# Patient Record
Sex: Female | Born: 1950 | Race: White | Hispanic: No | Marital: Married | Smoking: Never smoker
Health system: Southern US, Community
[De-identification: ages and names within clinical notes are randomized; demographics above are authoritative.]

## PROBLEM LIST (undated history)

## (undated) DIAGNOSIS — E7800 Pure hypercholesterolemia, unspecified: Secondary | ICD-10-CM

## (undated) DIAGNOSIS — I10 Essential (primary) hypertension: Secondary | ICD-10-CM

---

## 1999-07-30 ENCOUNTER — Encounter (INDEPENDENT_AMBULATORY_CARE_PROVIDER_SITE_OTHER): Payer: Self-pay | Admitting: Specialist

## 1999-07-30 ENCOUNTER — Other Ambulatory Visit: Admission: RE | Admit: 1999-07-30 | Discharge: 1999-07-30 | Payer: Self-pay | Admitting: Gastroenterology

## 2004-09-16 ENCOUNTER — Ambulatory Visit: Payer: Self-pay | Admitting: Gastroenterology

## 2004-09-29 ENCOUNTER — Ambulatory Visit: Payer: Self-pay | Admitting: Gastroenterology

## 2006-08-17 ENCOUNTER — Ambulatory Visit (HOSPITAL_COMMUNITY): Admission: RE | Admit: 2006-08-17 | Discharge: 2006-08-17 | Payer: Self-pay | Admitting: Obstetrics and Gynecology

## 2007-08-17 ENCOUNTER — Ambulatory Visit: Payer: Self-pay | Admitting: Cardiovascular Disease

## 2007-08-29 ENCOUNTER — Ambulatory Visit: Payer: Self-pay

## 2007-08-29 ENCOUNTER — Encounter: Payer: Self-pay | Admitting: Cardiovascular Disease

## 2008-01-12 IMAGING — US US TRANSVAGINAL NON-OB
1 series · 14 of 25 positions shown · non-contrast
Comparison: none

CLINICAL DATA: Postmenopausal bleeding.
 TRANSABDOMINAL AND TRANSVAGINAL PELVIC ULTRASOUND:
TECHNIQUE: Both transabdominal and transvaginal ultrasound examinations of the pelvis were performed including evaluation of the uterus, ovaries, adnexal regions, and pelvic cul-de-sac.

[Series 1: us transvaginal non-ob · 0.21mm/px · 14 of 60 slices shown]
[im 1/60]
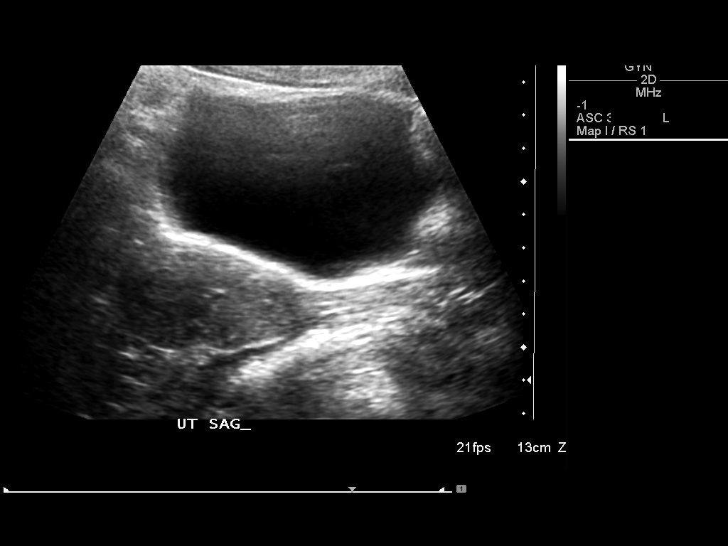
[im 5/60]
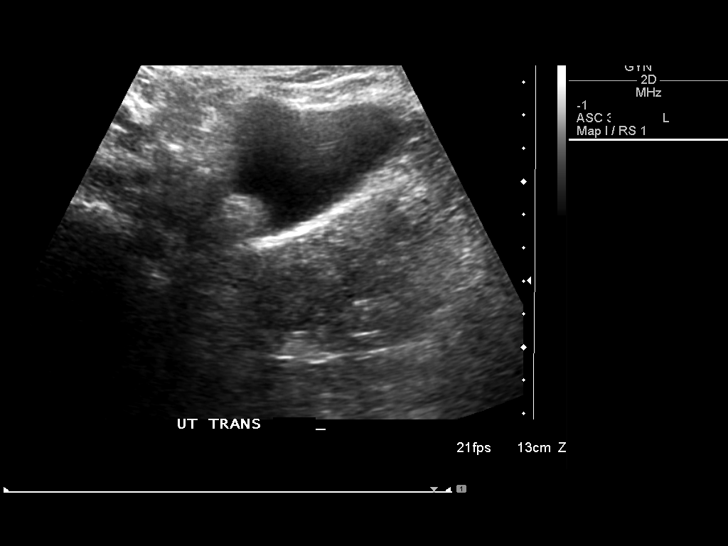
[im 10/60]
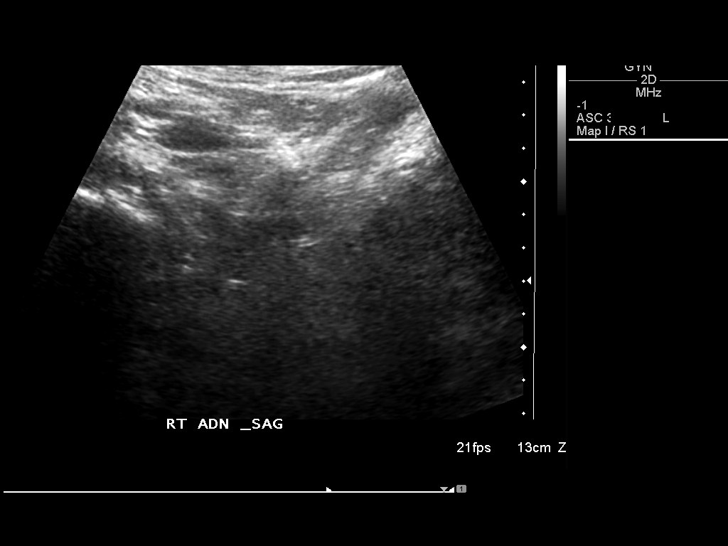
[im 15/60]
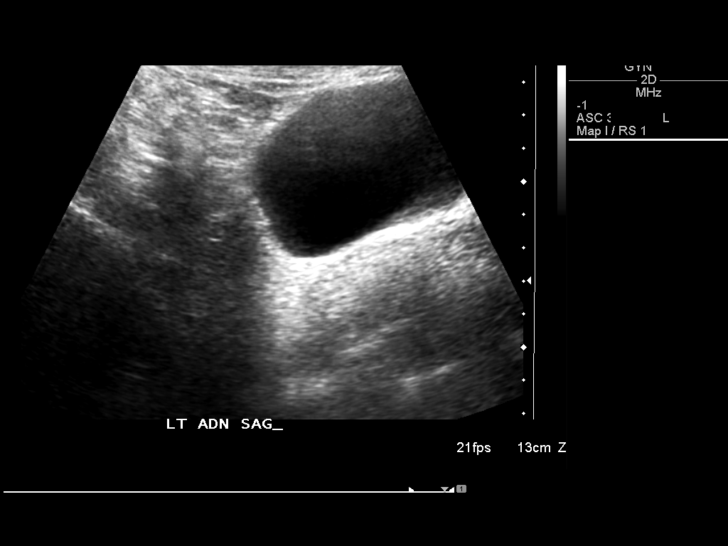
[im 20/60]
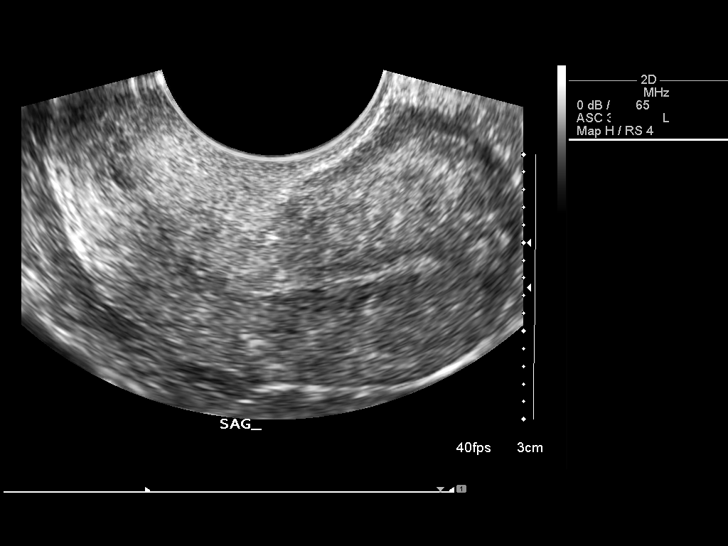
[im 23/60]
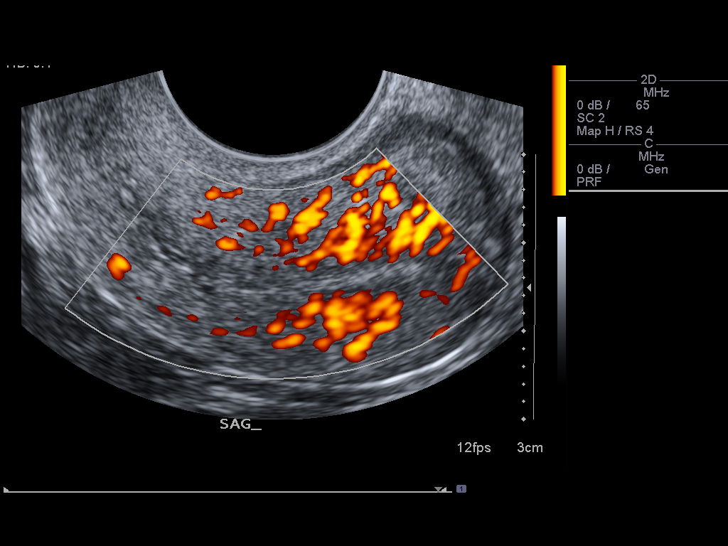
[im 28/60]
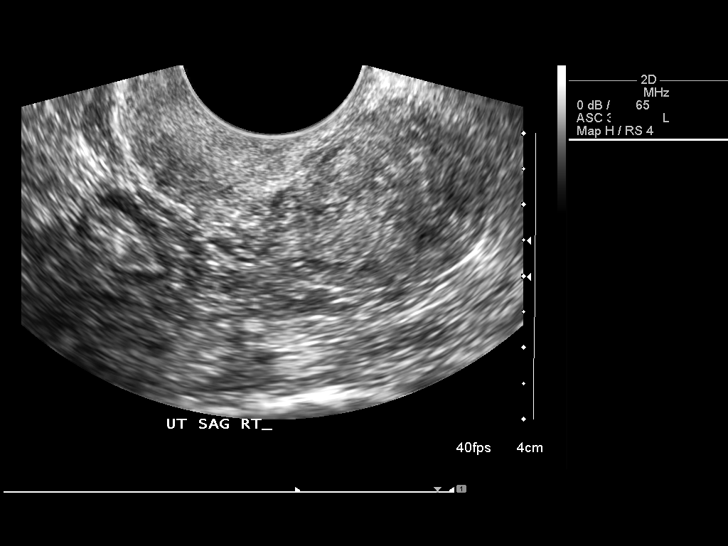
[im 32/60]
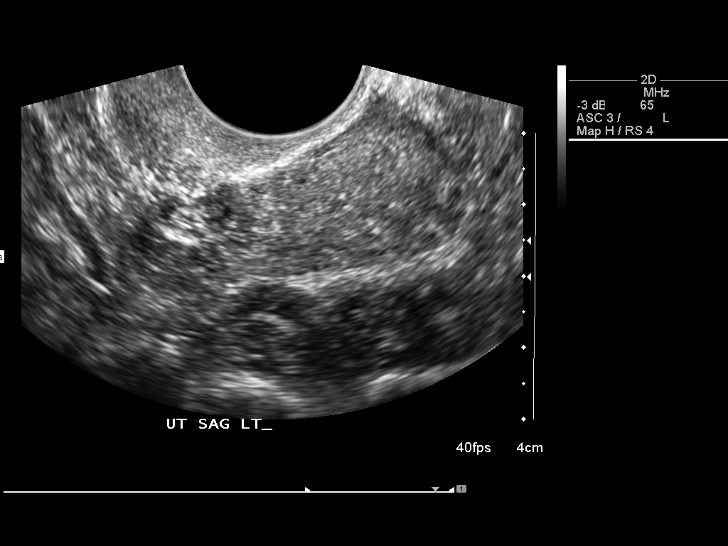
[im 37/60]
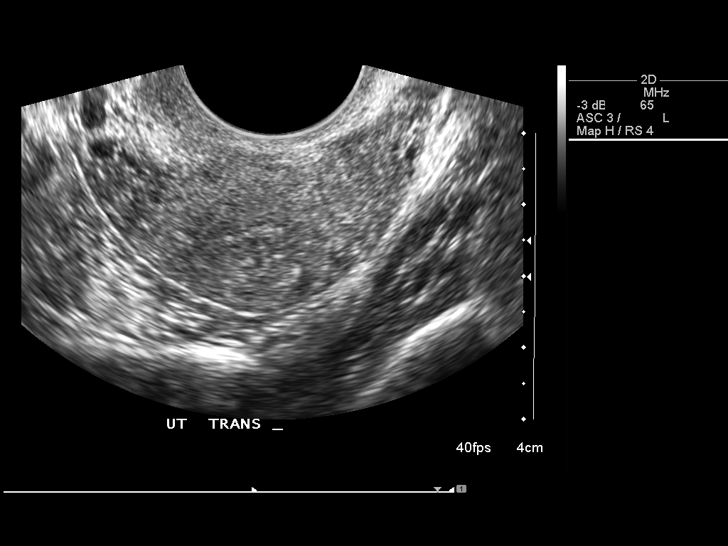
[im 40/60]
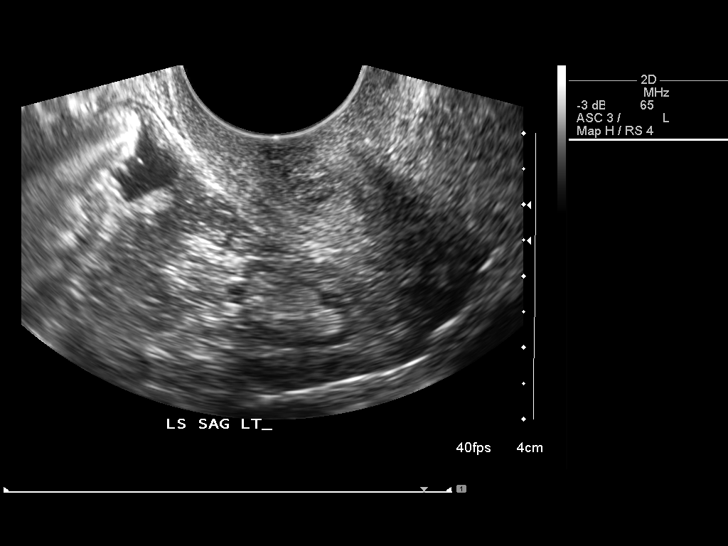
[im 45/60]
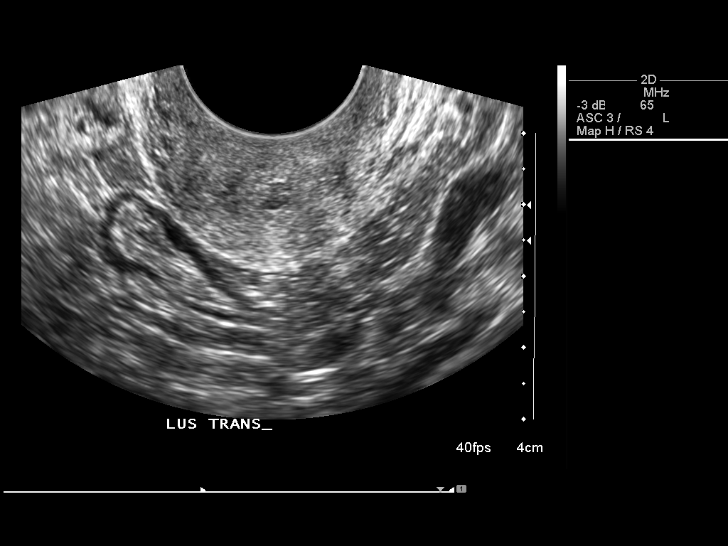
[im 50/60]
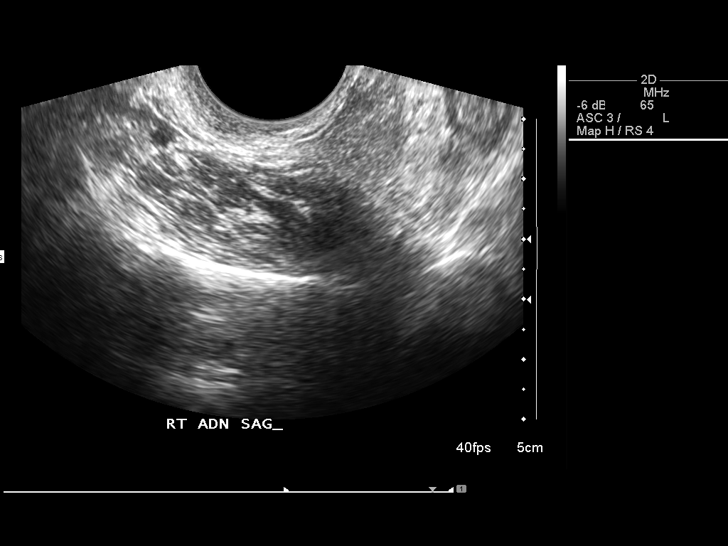
[im 55/60]
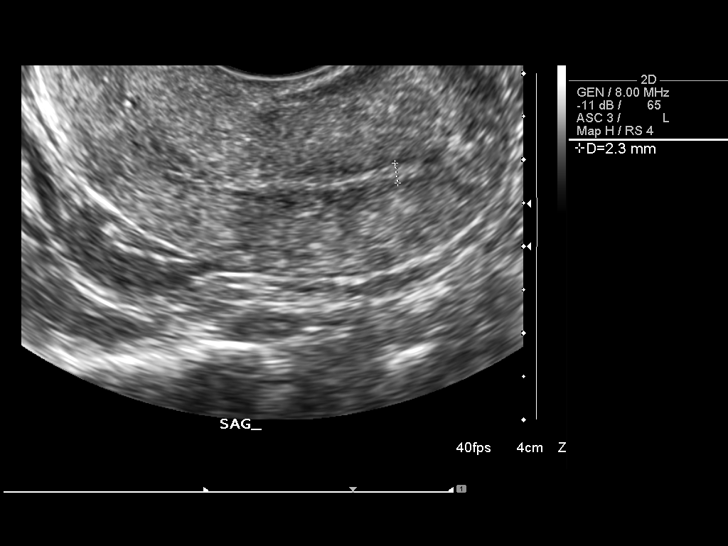
[im 60/60]
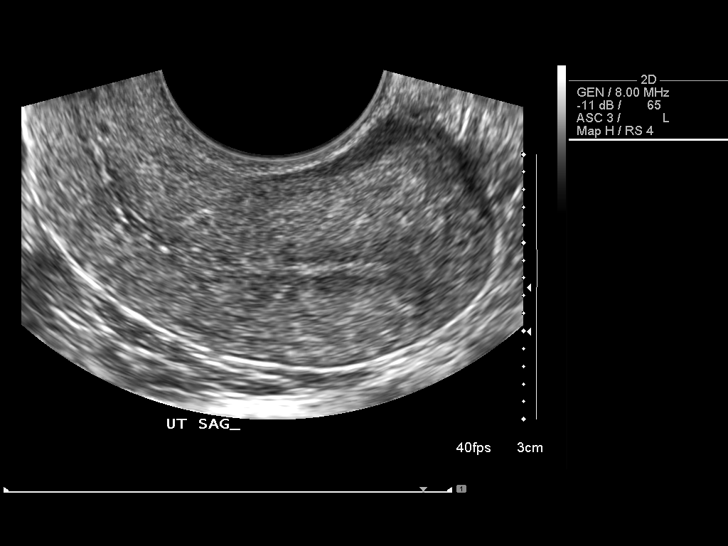

[14 of 25 positions shown; findings below may reference images not displayed]

FINDINGS: The uterus is normal in size, shape, and echotexture. The uterine dimensions are 6.4 x 3.0 x 3.9 cm.  No fibroids are seen. The endometrium is thin and homogeneous measuring 2-3 mm in width.  
 Neither ovary is visualized.  No adnexal masses or free pelvic fluid are seen.
IMPRESSION: Normal uterus and endometrial stripe.

## 2008-12-06 ENCOUNTER — Telehealth (INDEPENDENT_AMBULATORY_CARE_PROVIDER_SITE_OTHER): Payer: Self-pay | Admitting: *Deleted

## 2009-08-08 ENCOUNTER — Encounter (INDEPENDENT_AMBULATORY_CARE_PROVIDER_SITE_OTHER): Payer: Self-pay | Admitting: *Deleted

## 2010-03-06 NOTE — Letter (Signed)
Summary: Colonoscopy Letter  [facility]  [address] [place], [place] [address]   Phone: [phone]  Fax: [phone]      August 08, 2009 MRN: [id number]   [facility] [address] [place], [place]  [address]   Dear Ms. Dado,   According to your medical record, it is time for you to schedule a Colonoscopy. The American Cancer Society recommends this procedure as a method to detect early colon cancer. Patients with a family history of colon cancer, or a personal history of colon polyps or inflammatory bowel disease are at increased risk.  This letter has beeen generated based on the recommendations made at the time of your procedure. If you feel that in your particular situation this may no longer apply, please contact our office.  Please call our office at [phone] to schedule this appointment or to update your records at your earliest convenience.  Thank you for cooperating with [place] to provide you with the very best care possible.   Sincerely,    Vania Rea. Jarold Motto, M.D.  [facility] [phone]

## 2010-06-17 NOTE — Assessment & Plan Note (Signed)
[facility]                            CARDIOLOGY OFFICE NOTE   NAME:STEVENSCleona, Doubleday                     MRN:          [id number]  DATE:08/17/2007                            DOB:          05-Jun-1950    HISTORY:  Ms. Taite is a pleasant 60 year old patient referred by Dr.  Celene Skeen for some chest pain, palpitations, and dyspnea.   The patient has no previous cardiac history.  Coronary risk factors are  limited.  She is a previous smoker, quitting in 1975.  She is  nondiabetic and nonhypertensive.  She does have hypercholesterolemia, on  therapy.   At the end of June, about 2 weeks ago, she had some atypical chest pain.  This occurred after pushing a stroller, which is a little more activity  than she normally does.  She normally has a jogging stroller.  She had  her granddaughter with her and had a different stroller.  She  subsequently had dyspnea and atypical chest pain.  The pain was sharp,  radiated towards the shoulders, and lasted for about an hour.  She had  no associated diaphoresis.  She did feel presyncopal after she had the  sharp pain.   She also indicated it was a very hot humid day.   She has not had any recurrences in the last 2 weeks.   REVIEW OF SYSTEMS:  Otherwise negative.   PAST MEDICAL HISTORY:  Remarkable for hypercholesterolemia.  Otherwise,  it is remarkable for occasional asthma as a child.  Otherwise negative.  She has not had previous surgeries.   ALLERGIES:  She is allergic to PENICILLIN and MYCINS.   MEDICATIONS:  1. Zetia 10 a day.  2. Niaspan 2 g at bedtime.  3. Aspirin.  4. Multivitamins.  5. Fish oil.  6. Vitamin C.   The patient follows an [employer] type diet.  She is a Runner, broadcasting/film/video.  Her days have been cut back to 2 days a week.  She has been under a  little bit of stress as they have been building a second house at [facility], which is finally finished.  Otherwise, she is not particularly  active.  She quit smoking in 1975 and does not drink.   FAMILY HISTORY:  Remarkable for father dying of Parkinson disease at age  70.  Her mother died at age 97 of a stroke.   There is previous question of slight tremulousness or tremor in the  upper extremities, and the patient has been evaluated at [facility], and does  not have Parkinson's.   PHYSICAL EXAMINATION:  GENERAL:  Remarkable for healthy-appearing,  middle-aged white female in no distress.  VITAL SIGNS:  Blood pressure 116/76, pulse 91 and regular, respiratory  rate 14, afebrile, and weight 148.  HEENT:  Unremarkable.  NECK:  Carotids are normal without bruits.  No lymphadenopathy,  thyromegaly, or JVP elevation.  LUNGS:  Clear.  Good diaphragmatic motion.  No wheezing.  HEART:  S1 and S2 with normal heart sounds.  PMI normal.  ABDOMEN:  Benign.  Bowel sounds positive.  No AAA.  No tenderness.  No  bruit.  No hepatosplenomegaly.  No hepatojugular reflux.  EXTREMITIES:  Distal pulses are intact.  No edema.  NEUROLOGIC:  Nonfocal.  SKIN:  Warm and dry.  MUSCULOSKELETAL:  No muscular weakness.   EKG is normal.   IMPRESSION:  1. Atypical chest pain.  Followup stress echo.  2. Palpitations, possible occult paroxysmal supraventricular      tachycardia, but this is in the setting of dehydration and atypical      chest pain.  We will continue to monitor.  If she has recurrent      palpitations, we will get an event monitor.  3. Dyspnea.  Again appears functional.  The patient's cardiopulmonary      exam is normal.  Her EKG is normal.  We will check an      echocardiogram as part of her stress echo to rule out structural      heart disease.  4. Hypercholesterolemia.  Followup primary care MD.  Continue Zetia      and Niaspan.   As long as the patient's stress echo is normal, she will be seen on an  as-needed basis.     Noralyn Pick. Eden Emms, MD, [facility]  Electronically Signed    PCN/MedQ  DD: 08/17/2007  DT: 08/17/2007  Job #:  [id number]   cc:   Charlesetta Shanks

## 2014-04-02 ENCOUNTER — Other Ambulatory Visit: Payer: Self-pay | Admitting: Dermatology

## 2014-04-26 ENCOUNTER — Other Ambulatory Visit: Payer: Self-pay | Admitting: Dermatology

## 2015-08-13 ENCOUNTER — Emergency Department (HOSPITAL_BASED_OUTPATIENT_CLINIC_OR_DEPARTMENT_OTHER)
Admission: EM | Admit: 2015-08-13 | Discharge: 2015-08-13 | Disposition: A | Discharge status: Home / Self Care | Payer: Medicare Other | Attending: Emergency Medicine | Admitting: Emergency Medicine

## 2015-08-13 ENCOUNTER — Encounter (HOSPITAL_BASED_OUTPATIENT_CLINIC_OR_DEPARTMENT_OTHER): Payer: Self-pay

## 2015-08-13 DIAGNOSIS — Z7982 Long term (current) use of aspirin: Secondary | ICD-10-CM | POA: Diagnosis not present

## 2015-08-13 DIAGNOSIS — R110 Nausea: Secondary | ICD-10-CM | POA: Diagnosis not present

## 2015-08-13 DIAGNOSIS — E785 Hyperlipidemia, unspecified: Secondary | ICD-10-CM | POA: Insufficient documentation

## 2015-08-13 DIAGNOSIS — Z79899 Other long term (current) drug therapy: Secondary | ICD-10-CM | POA: Insufficient documentation

## 2015-08-13 DIAGNOSIS — K5900 Constipation, unspecified: Secondary | ICD-10-CM | POA: Diagnosis not present

## 2015-08-13 HISTORY — DX: Pure hypercholesterolemia, unspecified: E78.00

## 2015-08-13 NOTE — ED Notes (Signed)
C/o constipation-last BM 6 days ago-recent narcotic pain use from knee injury-NAD-steady gait

## 2015-08-13 NOTE — Discharge Instructions (Signed)
Used 8 scoops of MiraLAX in 32 ounces of drink of your choice. Stay near a bathroom. Also use a Fleet enema. You can buy this over-the-counter. Return for uncontrolled vomiting sudden severe abdominal pain or not passing gas out of your bottom. Constipation, Adult Constipation is when a person has fewer than three bowel movements a week, has difficulty having a bowel movement, or has stools that are dry, hard, or larger than normal. As people grow older, constipation is more common. A low-fiber diet, not taking in enough fluids, and taking certain medicines may make constipation worse.  CAUSES   Certain medicines, such as antidepressants, pain medicine, iron supplements, antacids, and water pills.   Certain diseases, such as diabetes, irritable bowel syndrome (IBS), thyroid disease, or depression.   Not drinking enough water.   Not eating enough fiber-rich foods.   Stress or travel.   Lack of physical activity or exercise.   Ignoring the urge to have a bowel movement.   Using laxatives too much.  SIGNS AND SYMPTOMS   Having fewer than three bowel movements a week.   Straining to have a bowel movement.   Having stools that are hard, dry, or larger than normal.   Feeling full or bloated.   Pain in the lower abdomen.   Not feeling relief after having a bowel movement.  DIAGNOSIS  Your health care provider will take a medical history and perform a physical exam. Further testing may be done for severe constipation. Some tests may include:  A barium enema X-ray to examine your rectum, colon, and, sometimes, your small intestine.   A sigmoidoscopy to examine your lower colon.   A colonoscopy to examine your entire colon. TREATMENT  Treatment will depend on the severity of your constipation and what is causing it. Some dietary treatments include drinking more fluids and eating more fiber-rich foods. Lifestyle treatments may include regular exercise. If these diet and  lifestyle recommendations do not help, your health care provider may recommend taking over-the-counter laxative medicines to help you have bowel movements. Prescription medicines may be prescribed if over-the-counter medicines do not work.  HOME CARE INSTRUCTIONS   Eat foods that have a lot of fiber, such as fruits, vegetables, whole grains, and beans.  Limit foods high in fat and processed sugars, such as french fries, hamburgers, cookies, candies, and soda.   A fiber supplement may be added to your diet if you cannot get enough fiber from foods.   Drink enough fluids to keep your urine clear or pale yellow.   Exercise regularly or as directed by your health care provider.   Go to the restroom when you have the urge to go. Do not hold it.   Only take over-the-counter or prescription medicines as directed by your health care provider. Do not take other medicines for constipation without talking to your health care provider first.  SEEK IMMEDIATE MEDICAL CARE IF:   You have bright red blood in your stool.   Your constipation lasts for more than 4 days or gets worse.   You have abdominal or rectal pain.   You have thin, pencil-like stools.   You have unexplained weight loss. MAKE SURE YOU:   Understand these instructions.  Will watch your condition.  Will get help right away if you are not doing well or get worse.   This information is not intended to replace advice given to you by your health care provider. Make sure you discuss any questions you have with  your health care provider.   Document Released: 10/18/2003 Document Revised: 02/09/2014 Document Reviewed: 10/31/2012 Elsevier Interactive Patient Education [employer].

## 2015-08-13 NOTE — ED Provider Notes (Signed)
CSN: [id number]     Arrival date & time 08/13/15  1739 History   By signing my name below, I, Rosario AdieWilliam Andrew Hiatt, attest that this documentation has been prepared under the direction and in the presence of Melene Planan Odean Mcelwain, DO.  Electronically Signed: Rosario AdieWilliam Andrew Hiatt, ED Scribe. 08/13/2015. 6:13 PM.   Chief Complaint  Patient presents with  . Constipation   Patient is a 65 y.o. female presenting with constipation. The history is provided by the patient. No language interpreter was used.  Constipation Severity:  Moderate Time since last bowel movement:  6 days Timing:  Constant Progression:  Unchanged Chronicity:  New Context: medication and narcotics   Context: not dehydration, not dietary changes and not stress   Stool description:  Watery Relieved by:  Miralax Worsened by:  Narcotic pain medications Ineffective treatments:  None tried Associated symptoms: nausea   Associated symptoms: no abdominal pain, no dysuria, no fever and no vomiting   Associated symptoms comment:  Positive for bloating. Risk factors: change in medication   Risk factors: no hx of abdominal surgery, no obesity, no recent antibiotic use, no recent illness, no recent surgery and no recent travel    HPI Comments: Sabrina Kelly is a 65 y.o. female with a PMHx of HLD who presents to the Emergency Department complaining of gradual onset, unchanged, constant constipation, with associated bloating, onset 6 days PTA. Pt has taken Miralax intermittently 1 day ago and today prior to coming into the ED and notes that since she has taken it that she has had small, brown, watery bowel movements. Pt has been eating foods and drinking fluids less since the onset of her symptoms. No PSHx to the abdomen. Pt reports that she injured her knee 4 days ago, and she recently started taking Hydrocodone and 81mg  Asprin for her pain to the area. Pt has also been resting significantly more since she injured her knee. She denies emesis or  any other symptoms.   Past Medical History  Diagnosis Date  . High cholesterol    History reviewed. No pertinent past surgical history. No family history on file. Social History  Substance Use Topics  . Smoking status: Never Smoker   . Smokeless tobacco: None  . Alcohol Use: Yes     Comment: occ   OB History    No data available     Review of Systems  Constitutional: Negative for fever and chills.  HENT: Negative for congestion and rhinorrhea.   Eyes: Negative for redness and visual disturbance.  Respiratory: Negative for shortness of breath and wheezing.   Cardiovascular: Negative for chest pain and palpitations.  Gastrointestinal: Positive for nausea, constipation and abdominal distention. Negative for vomiting and abdominal pain.  Genitourinary: Negative for dysuria and urgency.  Musculoskeletal: Negative for myalgias and arthralgias.  Skin: Negative for pallor and wound.  Neurological: Negative for dizziness and headaches.   Allergies  Adhesive; Erythromycin; Latex; and Penicillins  Home Medications   Prior to Admission medications   Medication Sig Start Date End Date Taking? Authorizing Provider  ezetimibe (ZETIA) 10 MG tablet Take 10 mg by mouth daily.   Yes Historical Provider, MD  pravastatin (PRAVACHOL) 40 MG tablet Take 40 mg by mouth daily.   Yes Historical Provider, MD   BP 177/93 mmHg  Pulse 74  Temp(Src) 98.5 F (36.9 C) (Oral)  Resp 18  Ht 5\' 4"  (1.626 m)  Wt 153 lb (69.4 kg)  BMI 26.25 kg/m2  SpO2 97%   Physical  Exam  Constitutional: She is oriented to person, place, and time. She appears well-developed and well-nourished. No distress.  HENT:  Head: Normocephalic and atraumatic.  Eyes: EOM are normal.  Neck: Normal range of motion.  Cardiovascular: Normal rate and regular rhythm.  Exam reveals no gallop and no friction rub.   No murmur heard. Pulmonary/Chest: Effort normal. She has no wheezes. She has no rales.  Abdominal: Soft. She exhibits  no distension. There is no tenderness.  No noted abdominal tenderness.  Genitourinary:  Upon rectal exam: Small, non-bleeding and non-thrombosed hemorrhoid at the 12 o'clock position with clay like stool upon the tip of the finger.  Musculoskeletal: She exhibits no edema or tenderness.  Neurological: She is alert and oriented to person, place, and time.  Skin: Skin is warm and dry. She is not diaphoretic.  Psychiatric: She has a normal mood and affect. Her behavior is normal.  Nursing note and vitals reviewed.  ED Course  Procedures (including critical care time)  DIAGNOSTIC STUDIES: Oxygen Saturation is 97% on RA, normal by my interpretation.   COORDINATION OF CARE: 6:09 PM-Discussed next steps with pt including rectal exam. Pt verbalized understanding and is agreeable with the plan.   Labs Review Labs Reviewed - No data to display  Imaging Review No results found.   I have personally reviewed and evaluated these images and lab results as part of my medical decision-making.   EKG Interpretation None      MDM   Final diagnoses:  None    65 yo F with constipation.  Not impacted on my exam.  Tolerating PO with benign abdominal exam.  Miralax cleanout with enema at home.    I personally performed the services described in this documentation, which was scribed in my presence. The recorded information has been reviewed and is accurate.      Melene Plan, DO 08/13/15 1910

## 2019-01-11 ENCOUNTER — Emergency Department (HOSPITAL_BASED_OUTPATIENT_CLINIC_OR_DEPARTMENT_OTHER)
Admission: EM | Admit: 2019-01-11 | Discharge: 2019-01-11 | Disposition: A | Discharge status: Home / Self Care | Payer: Medicare Other | Attending: Emergency Medicine | Admitting: Emergency Medicine

## 2019-01-11 ENCOUNTER — Encounter (HOSPITAL_BASED_OUTPATIENT_CLINIC_OR_DEPARTMENT_OTHER): Payer: Self-pay | Admitting: Emergency Medicine

## 2019-01-11 ENCOUNTER — Other Ambulatory Visit: Payer: Self-pay

## 2019-01-11 DIAGNOSIS — Z20828 Contact with and (suspected) exposure to other viral communicable diseases: Secondary | ICD-10-CM | POA: Insufficient documentation

## 2019-01-11 DIAGNOSIS — I10 Essential (primary) hypertension: Secondary | ICD-10-CM | POA: Diagnosis not present

## 2019-01-11 DIAGNOSIS — R52 Pain, unspecified: Secondary | ICD-10-CM | POA: Diagnosis present

## 2019-01-11 DIAGNOSIS — J069 Acute upper respiratory infection, unspecified: Secondary | ICD-10-CM | POA: Diagnosis not present

## 2019-01-11 DIAGNOSIS — Z79899 Other long term (current) drug therapy: Secondary | ICD-10-CM | POA: Diagnosis not present

## 2019-01-11 DIAGNOSIS — Z20822 Contact with and (suspected) exposure to covid-19: Secondary | ICD-10-CM

## 2019-01-11 DIAGNOSIS — R05 Cough: Secondary | ICD-10-CM | POA: Diagnosis not present

## 2019-01-11 HISTORY — DX: Essential (primary) hypertension: I10

## 2019-01-11 LAB — SARS CORONAVIRUS 2 (TAT 6-24 HRS): SARS Coronavirus 2: NEGATIVE

## 2019-01-11 MED ORDER — BENZONATATE 100 MG PO CAPS: 100.0000 mg | ORAL_CAPSULE | Freq: Three times a day (TID) | ORAL | 0 refills | Status: AC

## 2019-01-11 MED ORDER — BENZONATATE 100 MG PO CAPS: 100.0000 mg | ORAL_CAPSULE | Freq: Three times a day (TID) | ORAL | 0 refills | Status: DC

## 2019-01-11 MED ORDER — ACETAMINOPHEN 500 MG PO TABS
1000.0000 mg | ORAL_TABLET | Freq: Once | ORAL | Status: AC
  Administered 2019-01-11: 16:00:00 1000 mg via ORAL
  Filled 2019-01-11: qty 2

## 2019-01-11 MED FILL — BENZONATATE 100 MG CAP: 100 | 7 days supply | Qty: 21 | Fill #0

## 2019-01-11 NOTE — Discharge Instructions (Signed)
You have a Covid test pending.  You can check online or on MyChart regarding your tests.  You should take about 24 hours to come back.  You should quarantine until your results come back.  Take Tessalon Perles as directed.   Take Tylenol or ibuprofen as needed for fever and aches.  Make sure you are staying hydrated and drink plenty of fluids.  Return the emergency department for any difficulty breathing, vomiting, inability to eat or drink or any other worsening or concerning symptoms.     Person Under Monitoring Name: MIAKODA Kelly  Location: [address] [place] [address]   Infection Prevention Recommendations for Individuals Confirmed to have, or Being Evaluated for, 2019 Novel Coronavirus (COVID-19) Infection Who Receive Care at Home  Individuals who are confirmed to have, or are being evaluated for, COVID-19 should follow the prevention steps below until a healthcare provider or local or state health department says they can return to normal activities.  Stay home except to get medical care You should restrict activities outside your home, except for getting medical care. Do not go to work, school, or public areas, and do not use public transportation or taxis.  Call ahead before visiting your doctor Before your medical appointment, call the healthcare provider and tell them that you have, or are being evaluated for, COVID-19 infection. This will help the healthcare providers office take steps to keep other people from getting infected. Ask your healthcare provider to call the local or state health department.  Monitor your symptoms Seek prompt medical attention if your illness is worsening (e.g., difficulty breathing). Before going to your medical appointment, call the healthcare provider and tell them that you have, or are being evaluated for, COVID-19 infection. Ask your healthcare provider to call the local or state health department.  Wear a  facemask You should wear a facemask that covers your nose and mouth when you are in the same room with other people and when you visit a healthcare provider. People who live with or visit you should also wear a facemask while they are in the same room with you.  Separate yourself from other people in your home As much as possible, you should stay in a different room from other people in your home. Also, you should use a separate bathroom, if available.  Avoid sharing household items You should not share dishes, drinking glasses, cups, eating utensils, towels, bedding, or other items with other people in your home. After using these items, you should wash them thoroughly with soap and water.  Cover your coughs and sneezes Cover your mouth and nose with a tissue when you cough or sneeze, or you can cough or sneeze into your sleeve. Throw used tissues in a lined trash can, and immediately wash your hands with soap and water for at least 20 seconds or use an alcohol-based hand rub.  Wash your [employer] your hands often and thoroughly with soap and water for at least 20 seconds. You can use an alcohol-based hand sanitizer if soap and water are not available and if your hands are not visibly dirty. Avoid touching your eyes, nose, and mouth with unwashed hands.   Prevention Steps for Caregivers and Household Members of Individuals Confirmed to have, or Being Evaluated for, COVID-19 Infection Being Cared for in the Home  If you live with, or provide care at home for, a person confirmed to have, or being evaluated for, COVID-19 infection please follow these guidelines to  prevent infection:  Follow healthcare providers instructions Make sure that you understand and can help the patient follow any healthcare provider instructions for all care.  Provide for the patients basic needs You should help the patient with basic needs in the home and provide support for getting groceries,  prescriptions, and other personal needs.  Monitor the patients symptoms If they are getting sicker, call his or her medical provider and tell them that the patient has, or is being evaluated for, COVID-19 infection. This will help the healthcare providers office take steps to keep other people from getting infected. Ask the healthcare provider to call the local or state health department.  Limit the number of people who have contact with the patient  If possible, have only one caregiver for the patient.  Other household members should stay in another home or place of residence. If this is not possible, they should stay  in another room, or be separated from the patient as much as possible. Use a separate bathroom, if available.  Restrict visitors who do not have an essential need to be in the home.  Keep older adults, very young children, and other sick people away from the patient Keep older adults, very young children, and those who have compromised immune systems or chronic health conditions away from the patient. This includes people with chronic heart, lung, or kidney conditions, diabetes, and cancer.  Ensure good ventilation Make sure that shared spaces in the home have good air flow, such as from an air conditioner or an opened window, weather permitting.  Wash your hands often  Wash your hands often and thoroughly with soap and water for at least 20 seconds. You can use an alcohol based hand sanitizer if soap and water are not available and if your hands are not visibly dirty.  Avoid touching your eyes, nose, and mouth with unwashed hands.  Use disposable paper towels to dry your hands. If not available, use dedicated cloth towels and replace them when they become wet.  Wear a facemask and gloves  Wear a disposable facemask at all times in the room and gloves when you touch or have contact with the patients blood, body fluids, and/or secretions or excretions, such as  sweat, saliva, sputum, nasal mucus, vomit, urine, or feces.  Ensure the mask fits over your nose and mouth tightly, and do not touch it during use.  Throw out disposable facemasks and gloves after using them. Do not reuse.  Wash your hands immediately after removing your facemask and gloves.  If your personal clothing becomes contaminated, carefully remove clothing and launder. Wash your hands after handling contaminated clothing.  Place all used disposable facemasks, gloves, and other waste in a lined container before disposing them with other household waste.  Remove gloves and wash your hands immediately after handling these items.  Do not share dishes, glasses, or other household items with the patient  Avoid sharing household items. You should not share dishes, drinking glasses, cups, eating utensils, towels, bedding, or other items with a patient who is confirmed to have, or being evaluated for, COVID-19 infection.  After the person uses these items, you should wash them thoroughly with soap and water.  Wash laundry thoroughly  Immediately remove and wash clothes or bedding that have blood, body fluids, and/or secretions or excretions, such as sweat, saliva, sputum, nasal mucus, vomit, urine, or feces, on them.  Wear gloves when handling laundry from the patient.  Read and follow directions on labels  of laundry or clothing items and detergent. In general, wash and dry with the warmest temperatures recommended on the label.  Clean all areas the individual has used often  Clean all touchable surfaces, such as counters, tabletops, doorknobs, bathroom fixtures, toilets, phones, keyboards, tablets, and bedside tables, every day. Also, clean any surfaces that may have blood, body fluids, and/or secretions or excretions on them.  Wear gloves when cleaning surfaces the patient has come in contact with.  Use a diluted bleach solution (e.g., dilute bleach with 1 part bleach and 10 parts  water) or a household disinfectant with a label that says EPA-registered for coronaviruses. To make a bleach solution at home, add 1 tablespoon of bleach to 1 quart (4 cups) of water. For a larger supply, add  cup of bleach to 1 gallon (16 cups) of water.  Read labels of cleaning products and follow recommendations provided on product labels. Labels contain instructions for safe and effective use of the cleaning product including precautions you should take when applying the product, such as wearing gloves or eye protection and making sure you have good ventilation during use of the product.  Remove gloves and wash hands immediately after cleaning.  Monitor yourself for signs and symptoms of illness Caregivers and household members are considered close contacts, should monitor their health, and will be asked to limit movement outside of the home to the extent possible. Follow the monitoring steps for close contacts listed on the symptom monitoring form.   ? If you have additional questions, contact your local health department or call the epidemiologist on call at [phone] (available 24/7). ? This guidance is subject to change. For the most up-to-date guidance from [facility], please refer to their website: TripMetro.huhttps://www.cdc.gov/coronavirus/2019-ncov/hcp/guidance-prevent-spread.html

## 2019-01-11 NOTE — ED Provider Notes (Addendum)
[facility] EMERGENCY DEPARTMENT Provider Note   CSN: [id number] Arrival date & time: 01/11/19  1341     History   Chief Complaint Chief Complaint  Patient presents with   URI    HPI Sabrina Kelly is a 68 y.o. female with PMH/o HTN who presents for evaluation of 4 days of generalized body aches, congestion, fevers and chills, cough, decreased appetite. She reports that over the last several days, she is "just felt rundown with no energy." She states that she has not measured her temperature but has had subjective fevers and chills. She states she has had a cough but it is not productive. She has not any trouble breathing. She reports some burning noted in her throat but no chest pain. She denies any difficulty breathing. No history of COPD or asthma. She states she has not wanted to eat anything but has not had any vomiting. She states that her husband was recently diagnosed with pneumonia. He had a Covid test but she does not know the results of his Covid test yet. She denies any other known COVID-19 exposure.     The history is provided by the patient.    Past Medical History:  Diagnosis Date   High cholesterol    Hypertension     There are no active problems to display for this patient.   History reviewed. No pertinent surgical history.   OB History   No obstetric history on file.      Home Medications    Prior to Admission medications   Medication Sig Start Date End Date Taking? Authorizing Provider  benzonatate (TESSALON) 100 MG capsule Take 1 capsule (100 mg total) by mouth every 8 (eight) hours. 01/11/19   Maxwell Caul, PA-C  ezetimibe (ZETIA) 10 MG tablet Take 10 mg by mouth daily.    [provider]  pravastatin (PRAVACHOL) 40 MG tablet Take 40 mg by mouth daily.    [provider]    Family History History reviewed. No pertinent family history.  Social History Social History   Tobacco Use   Smoking status: Never  Smoker   Smokeless tobacco: Never Used  Substance Use Topics   Alcohol use: Yes    Comment: occ   Drug use: No     Allergies   Pneumococcal vaccines, Adhesive [tape], Erythromycin, Latex, and Penicillins   Review of Systems Review of Systems  Constitutional: Positive for appetite change, chills, fatigue and fever.  Respiratory: Positive for cough. Negative for shortness of breath.   Cardiovascular: Negative for chest pain.  Gastrointestinal: Negative for abdominal pain, nausea and vomiting.  Genitourinary: Negative for hematuria.  Musculoskeletal: Positive for myalgias.  All other systems reviewed and are negative.    Physical Exam Updated Vital Signs BP 120/80 (BP Location: Right Arm)    Pulse 82    Temp (!) 101.8 F (38.8 C) (Oral)    Resp 18    Ht 5\' 4"  (1.626 m)    Wt 68.9 kg    SpO2 99%    BMI 26.09 kg/m   Physical Exam Vitals signs and nursing note reviewed.  Constitutional:      Appearance: Normal appearance. She is well-developed.  HENT:     Head: Normocephalic and atraumatic.  Eyes:     General: Lids are normal.     Conjunctiva/sclera: Conjunctivae normal.     Pupils: Pupils are equal, round, and reactive to light.  Neck:     Musculoskeletal: Full passive range of  motion without pain.  Cardiovascular:     Rate and Rhythm: Normal rate and regular rhythm.     Pulses: Normal pulses.     Heart sounds: Normal heart sounds. No murmur. No friction rub. No gallop.   Pulmonary:     Effort: Pulmonary effort is normal.     Breath sounds: Normal breath sounds.     Comments: Lungs clear to auscultation bilaterally.  Symmetric chest rise.  No wheezing, rales, rhonchi. Abdominal:     Palpations: Abdomen is soft. Abdomen is not rigid.     Tenderness: There is no abdominal tenderness. There is no guarding.     Comments: Abdomen is soft, non-distended, non-tender. No rigidity, No guarding. No peritoneal signs.  Musculoskeletal: Normal range of motion.  Skin:     General: Skin is warm and dry.     Capillary Refill: Capillary refill takes less than 2 seconds.  Neurological:     Mental Status: She is alert and oriented to person, place, and time.  Psychiatric:        Speech: Speech normal.      ED Treatments / Results  Labs (all labs ordered are listed, but only abnormal results are displayed) Labs Reviewed  SARS CORONAVIRUS 2 (TAT 6-24 HRS)    EKG None  Radiology No results found.  Procedures Procedures (including critical care time)  Medications Ordered in ED Medications  acetaminophen (TYLENOL) tablet 1,000 mg (1,000 mg Oral Given 01/11/19 1627)     Initial Impression / Assessment and Plan / ED Course  I have reviewed the triage vital signs and the nursing notes.  Pertinent labs & imaging results that were available during my care of the patient were reviewed by me and considered in my medical decision making (see chart for details).        68 year old female who presents for evaluation of 4 days of myalgias, generalized body aches, subjective fever/chills. Reports has been recently diagnosed with pneumonia. He had a Covid test but does not know the results of that yet. Initially arrival, she is febrile. Vitals otherwise stable. No evidence of tachycardia or hypoxia. Benign lung exam. No evidence of rales. Benign abdominal exam. Clinically, her constellation of symptoms correlate with COVID-19 infection. There is no abdominal tenderness on my exam. Do not suspect intra-abdominal process. Additionally, lungs clear to auscultation. Do not suspect pneumonia. Additionally, do not feel the patient is septic. We will plan for COVID-19 testing. Patient instructed on supportive care measures. At this time, patient exhibits no emergent life-threatening condition that require further evaluation in ED or admission. Discussed patient with Dr. Ashok Cordia who is agreeable to plan. Patient had ample opportunity for questions and discussion. All  patient's questions were answered with full understanding. Strict return precautions discussed. Patient expresses understanding and agreement to plan.   Sabrina Kelly was evaluated in Emergency Department on 01/11/2019 for the symptoms described in the history of present illness. She was evaluated in the context of the global COVID-19 pandemic, which necessitated consideration that the patient might be at risk for infection with the SARS-CoV-2 virus that causes COVID-19. Institutional protocols and algorithms that pertain to the evaluation of patients at risk for COVID-19 are in a state of rapid change based on information released by regulatory bodies including the CDC and federal and state organizations. These policies and algorithms were followed during the patient's care in the ED.   Portions of this note were generated with [employer]. Dictation errors may occur despite best  attempts at proofreading.  Final Clinical Impressions(s) / ED Diagnoses   Final diagnoses:  Viral URI with cough  Suspected COVID-19 virus infection    ED Discharge Orders         Ordered    benzonatate (TESSALON) 100 MG capsule  Every 8 hours,   Status:  Discontinued     01/11/19 1636    benzonatate (TESSALON) 100 MG capsule  Every 8 hours     01/11/19 1636           Maxwell CaulLayden, Sehaj Mcenroe A, PA-C 01/11/19 1733    Maxwell CaulLayden, Keagen Heinlen A, PA-C 01/11/19 1733    Cathren LaineSteinl, Kevin, MD 01/11/19 1858

## 2019-01-11 NOTE — ED Triage Notes (Signed)
Runny nose, congestion, fever, burning with inspiration, non-productive cough, change in taste and smell, ear pain and sore throat since Saturday.

## 2019-02-23 ENCOUNTER — Ambulatory Visit: Payer: Medicare PPO | Attending: Internal Medicine

## 2019-02-23 DIAGNOSIS — Z23 Encounter for immunization: Secondary | ICD-10-CM | POA: Insufficient documentation

## 2019-02-23 NOTE — Progress Notes (Signed)
   Covid-19 Vaccination Clinic  Name:  Sabrina Kelly    MRN: [id number] DOB: 11-09-1950  02/23/2019  Sabrina Kelly was observed post Covid-19 immunization for 15 minutes without incidence. She was provided with Vaccine Information Sheet and instruction to access the V-Safe system.   Sabrina Kelly was instructed to call 911 with any severe reactions post vaccine: Marland Kitchen Difficulty breathing  . Swelling of your face and throat  . A fast heartbeat  . A bad rash all over your body  . Dizziness and weakness    Immunizations Administered    Name Date Dose VIS Date Route   Pfizer COVID-19 Vaccine 02/23/2019  2:48 PM 0.3 mL 01/13/2019 Intramuscular   Manufacturer: [employer], [employer]   Lot: [id number]   NDC: [address]-7

## 2019-03-16 ENCOUNTER — Ambulatory Visit: Payer: Medicare PPO | Attending: Internal Medicine

## 2019-03-16 DIAGNOSIS — Z23 Encounter for immunization: Secondary | ICD-10-CM | POA: Insufficient documentation

## 2019-03-16 NOTE — Progress Notes (Signed)
   Covid-19 Vaccination Clinic  Name:  Sabrina Kelly    MRN: [id number] DOB: Mar 04, 1950  03/16/2019  Sabrina Kelly was observed post Covid-19 immunization for 15 minutes without incidence. She was provided with Vaccine Information Sheet and instruction to access the V-Safe system.   Sabrina Kelly was instructed to call 911 with any severe reactions post vaccine: Marland Kitchen Difficulty breathing  . Swelling of your face and throat  . A fast heartbeat  . A bad rash all over your body  . Dizziness and weakness    Immunizations Administered    Name Date Dose VIS Date Route   Pfizer COVID-19 Vaccine 03/16/2019  3:30 PM 0.3 mL 01/13/2019 Intramuscular   Manufacturer: [employer], [employer]   Lot: [id number]   NDC: [address]-4
